# Patient Record
Sex: Female | Born: 1965 | Race: White | Hispanic: No | Marital: Married | Smoking: Never smoker
Health system: Southern US, Community
[De-identification: ages and names within clinical notes are randomized; demographics above are authoritative.]

## PROBLEM LIST (undated history)

## (undated) DIAGNOSIS — G43909 Migraine, unspecified, not intractable, without status migrainosus: Secondary | ICD-10-CM

## (undated) DIAGNOSIS — E079 Disorder of thyroid, unspecified: Secondary | ICD-10-CM

---

## 1999-05-06 ENCOUNTER — Other Ambulatory Visit: Admission: RE | Admit: 1999-05-06 | Discharge: 1999-05-06 | Payer: Self-pay | Admitting: *Deleted

## 2000-05-05 ENCOUNTER — Other Ambulatory Visit: Admission: RE | Admit: 2000-05-05 | Discharge: 2000-05-05 | Payer: Self-pay | Admitting: Obstetrics and Gynecology

## 2000-06-24 ENCOUNTER — Ambulatory Visit (HOSPITAL_COMMUNITY): Admission: RE | Admit: 2000-06-24 | Discharge: 2000-06-24 | Payer: Self-pay | Admitting: Obstetrics and Gynecology

## 2000-06-24 ENCOUNTER — Encounter: Payer: Self-pay | Admitting: Obstetrics and Gynecology

## 2001-05-28 ENCOUNTER — Other Ambulatory Visit: Admission: RE | Admit: 2001-05-28 | Discharge: 2001-05-28 | Payer: Self-pay | Admitting: Obstetrics and Gynecology

## 2001-09-14 ENCOUNTER — Ambulatory Visit (HOSPITAL_BASED_OUTPATIENT_CLINIC_OR_DEPARTMENT_OTHER): Admission: RE | Admit: 2001-09-14 | Discharge: 2001-09-14 | Payer: Self-pay | Admitting: Plastic Surgery

## 2001-09-14 ENCOUNTER — Encounter (INDEPENDENT_AMBULATORY_CARE_PROVIDER_SITE_OTHER): Payer: Self-pay | Admitting: Specialist

## 2001-10-19 ENCOUNTER — Inpatient Hospital Stay (HOSPITAL_COMMUNITY): Admission: AD | Admit: 2001-10-19 | Discharge: 2001-10-19 | Payer: Self-pay | Admitting: Obstetrics and Gynecology

## 2002-01-12 ENCOUNTER — Inpatient Hospital Stay (HOSPITAL_COMMUNITY): Admission: AD | Admit: 2002-01-12 | Discharge: 2002-01-12 | Payer: Self-pay | Admitting: Obstetrics and Gynecology

## 2002-01-31 ENCOUNTER — Encounter: Admission: RE | Admit: 2002-01-31 | Discharge: 2002-01-31 | Payer: Self-pay | Admitting: Obstetrics and Gynecology

## 2002-01-31 ENCOUNTER — Encounter: Payer: Self-pay | Admitting: Obstetrics and Gynecology

## 2002-02-06 ENCOUNTER — Encounter: Payer: Self-pay | Admitting: Obstetrics and Gynecology

## 2002-02-06 ENCOUNTER — Inpatient Hospital Stay (HOSPITAL_COMMUNITY): Admission: RE | Admit: 2002-02-06 | Discharge: 2002-02-06 | Payer: Self-pay | Admitting: Obstetrics and Gynecology

## 2002-02-07 ENCOUNTER — Inpatient Hospital Stay (HOSPITAL_COMMUNITY): Admission: AD | Admit: 2002-02-07 | Discharge: 2002-02-07 | Payer: Self-pay | Admitting: Obstetrics and Gynecology

## 2002-03-06 ENCOUNTER — Inpatient Hospital Stay (HOSPITAL_COMMUNITY): Admission: RE | Admit: 2002-03-06 | Discharge: 2002-03-06 | Payer: Self-pay | Admitting: Obstetrics and Gynecology

## 2002-03-06 ENCOUNTER — Encounter: Payer: Self-pay | Admitting: Obstetrics and Gynecology

## 2002-08-01 ENCOUNTER — Other Ambulatory Visit: Admission: RE | Admit: 2002-08-01 | Discharge: 2002-08-01 | Payer: Self-pay | Admitting: Obstetrics and Gynecology

## 2002-08-05 ENCOUNTER — Encounter: Payer: Self-pay | Admitting: Obstetrics and Gynecology

## 2002-08-05 ENCOUNTER — Encounter: Admission: RE | Admit: 2002-08-05 | Discharge: 2002-08-05 | Payer: Self-pay | Admitting: Obstetrics and Gynecology

## 2002-08-09 ENCOUNTER — Encounter: Payer: Self-pay | Admitting: Family Medicine

## 2002-08-09 ENCOUNTER — Encounter: Admission: RE | Admit: 2002-08-09 | Discharge: 2002-08-09 | Payer: Self-pay | Admitting: Family Medicine

## 2002-08-16 ENCOUNTER — Ambulatory Visit (HOSPITAL_COMMUNITY): Admission: RE | Admit: 2002-08-16 | Discharge: 2002-08-16 | Payer: Self-pay | Admitting: Family Medicine

## 2002-08-16 ENCOUNTER — Encounter: Payer: Self-pay | Admitting: Family Medicine

## 2002-08-26 ENCOUNTER — Encounter: Payer: Self-pay | Admitting: Obstetrics and Gynecology

## 2002-08-26 ENCOUNTER — Encounter: Admission: RE | Admit: 2002-08-26 | Discharge: 2002-08-26 | Payer: Self-pay | Admitting: Obstetrics and Gynecology

## 2002-08-26 ENCOUNTER — Encounter (INDEPENDENT_AMBULATORY_CARE_PROVIDER_SITE_OTHER): Payer: Self-pay | Admitting: Specialist

## 2002-12-05 ENCOUNTER — Ambulatory Visit (HOSPITAL_COMMUNITY): Admission: RE | Admit: 2002-12-05 | Discharge: 2002-12-05 | Payer: Self-pay | Admitting: Endocrinology

## 2002-12-05 ENCOUNTER — Encounter (INDEPENDENT_AMBULATORY_CARE_PROVIDER_SITE_OTHER): Payer: Self-pay | Admitting: *Deleted

## 2002-12-05 ENCOUNTER — Encounter: Payer: Self-pay | Admitting: Endocrinology

## 2003-05-31 ENCOUNTER — Encounter: Admission: RE | Admit: 2003-05-31 | Discharge: 2003-05-31 | Payer: Self-pay | Admitting: Endocrinology

## 2003-08-22 ENCOUNTER — Other Ambulatory Visit: Admission: RE | Admit: 2003-08-22 | Discharge: 2003-08-22 | Payer: Self-pay | Admitting: Obstetrics and Gynecology

## 2003-08-31 ENCOUNTER — Encounter: Admission: RE | Admit: 2003-08-31 | Discharge: 2003-08-31 | Payer: Self-pay | Admitting: Family Medicine

## 2004-07-17 ENCOUNTER — Ambulatory Visit: Payer: Self-pay | Admitting: Oncology

## 2004-08-18 ENCOUNTER — Inpatient Hospital Stay (HOSPITAL_COMMUNITY): Admission: AD | Admit: 2004-08-18 | Discharge: 2004-08-20 | Payer: Self-pay | Admitting: Obstetrics and Gynecology

## 2004-08-23 ENCOUNTER — Inpatient Hospital Stay (HOSPITAL_COMMUNITY): Admission: AD | Admit: 2004-08-23 | Discharge: 2004-08-23 | Payer: Self-pay | Admitting: Obstetrics and Gynecology

## 2004-09-02 ENCOUNTER — Ambulatory Visit: Payer: Self-pay | Admitting: Oncology

## 2004-09-22 ENCOUNTER — Inpatient Hospital Stay (HOSPITAL_COMMUNITY): Admission: AD | Admit: 2004-09-22 | Discharge: 2004-09-25 | Payer: Self-pay | Admitting: Obstetrics & Gynecology

## 2004-09-23 ENCOUNTER — Encounter (INDEPENDENT_AMBULATORY_CARE_PROVIDER_SITE_OTHER): Payer: Self-pay | Admitting: Specialist

## 2004-10-11 ENCOUNTER — Encounter: Admission: RE | Admit: 2004-10-11 | Discharge: 2004-11-10 | Payer: Self-pay | Admitting: Obstetrics and Gynecology

## 2005-01-31 ENCOUNTER — Encounter: Admission: RE | Admit: 2005-01-31 | Discharge: 2005-01-31 | Payer: Self-pay | Admitting: Family Medicine

## 2005-03-20 ENCOUNTER — Encounter (INDEPENDENT_AMBULATORY_CARE_PROVIDER_SITE_OTHER): Payer: Self-pay | Admitting: Specialist

## 2005-03-20 ENCOUNTER — Ambulatory Visit (HOSPITAL_COMMUNITY): Admission: RE | Admit: 2005-03-20 | Discharge: 2005-03-21 | Payer: Self-pay | Admitting: Surgery

## 2005-08-05 ENCOUNTER — Encounter: Admission: RE | Admit: 2005-08-05 | Discharge: 2005-08-05 | Payer: Self-pay | Admitting: Obstetrics and Gynecology

## 2005-12-17 ENCOUNTER — Encounter (INDEPENDENT_AMBULATORY_CARE_PROVIDER_SITE_OTHER): Payer: Self-pay | Admitting: *Deleted

## 2005-12-17 ENCOUNTER — Ambulatory Visit (HOSPITAL_COMMUNITY): Admission: RE | Admit: 2005-12-17 | Discharge: 2005-12-17 | Payer: Self-pay | Admitting: Obstetrics and Gynecology

## 2006-08-13 ENCOUNTER — Encounter: Admission: RE | Admit: 2006-08-13 | Discharge: 2006-08-13 | Payer: Self-pay | Admitting: Obstetrics and Gynecology

## 2007-11-18 ENCOUNTER — Encounter: Admission: RE | Admit: 2007-11-18 | Discharge: 2007-11-18 | Payer: Self-pay | Admitting: Obstetrics and Gynecology

## 2008-08-29 ENCOUNTER — Encounter: Admission: RE | Admit: 2008-08-29 | Discharge: 2008-08-29 | Payer: Self-pay | Admitting: Family Medicine

## 2008-11-21 ENCOUNTER — Encounter: Admission: RE | Admit: 2008-11-21 | Discharge: 2008-11-21 | Payer: Self-pay | Admitting: Obstetrics and Gynecology

## 2010-01-08 ENCOUNTER — Encounter: Admission: RE | Admit: 2010-01-08 | Discharge: 2010-01-08 | Payer: Self-pay | Admitting: Obstetrics and Gynecology

## 2010-09-25 ENCOUNTER — Other Ambulatory Visit: Payer: Self-pay | Admitting: Obstetrics and Gynecology

## 2010-11-08 NOTE — Consult Note (Signed)
NAMEJEFF, MCCALLUM             ACCOUNT NO.:  [id number]   MEDICAL RECORD NO.:  [id number]          PATIENT TYPE:  MAT   LOCATION:  MATC                          FACILITY:  [facility]   PHYSICIAN:  Mark C. Vernie Ammons, M.D.  DATE OF BIRTH:  [birth year]   DATE OF CONSULTATION:  08/23/2004  DATE OF DISCHARGE:                                   CONSULTATION   The patient is a 45 year old white female, who is [redacted] weeks pregnant.  On the  previous Sunday, she began to have some pain in her left flank with  radiation into the abdomen.  A renal ultrasound was performed, and she  reports that is revealed no hydronephrosis.  She was admitted to the  hospital and observed.  She did have an elevated white count but otherwise,  she had no voiding complaints or hematuria.  An IVP was performed on the  27th, and this revealed bilateral caliectasis, more on the right than the  left and dilated ureters down to the uterus and the pelvis, as to be  expected.  No stone was seen, though.  She was discharged home on Tuesday  and did well Wednesday, but yesterday she began to have some pain in the  left lower quadrant, and it has become much worse.  She says it is now  tender to touch.  It is constant with waves of discomfort.  As far as her  urination goes, she says it seems to be somewhat slower.  She is having a  little constipation now because she has been taking pain medication.   PAST MEDICAL HISTORY:  Positive for borderline hyperthyroidism for which she  has undergone removal of multinodular goiter and surgical removal of a  benign breast lump.   MEDICATIONS:  Prenatal vitamins and PTU 25 mg daily.   FAMILY HISTORY:  Positive for hypothyroidism, CVA, depression, and aneurysm.   PHYSICAL EXAMINATION:  GENERAL:  The patient is a well-developed, well-  nourished white female in mild distress.  HEENT:  Atraumatic, normocephalic.  Oropharynx is clear.  NECK:  Supple with midline trachea.  CHEST:  Normal  respiratory effort.  CARDIOVASCULAR:  Regular rate and rhythm.  ABDOMEN:  Gravid, soft.  She is tender in the left lower quadrant, but I see  no lesions, and I can palpate no mass in the area.  She had no CVAT to  percussion.  SKIN:  Warm and dry.  EXTREMITIES:  Without clubbing, cyanosis, or edema.  NEUROLOGIC:  She has no gross focal neurologic deficits.   LABORATORY DATA:  Her urinalysis on the 26th had 0-2 red cells.  Renal  ultrasound revealed a simple cyst in the right kidney but no masses,  hydronephrosis, or stones.  IVP is as noted above.   CT scan:  Study reveals some fullness of the collecting systems bilaterally,  right more than left with some mild dilatation of both ureters down to the  pelvic brim.  I see no stones within the kidneys, nor are there any stones  within the ureters.   IMPRESSION:  Left-sided pain. It appears nonurologic in origin.  She has no  evidence of definite obstruction or stone at this time.   RECOMMENDATIONS:  It would appear that she needs a repeat CBC and possibly  general surgery consult.      MCO/MEDQ  D:  08/23/2004  T:  08/24/2004  Job:  [id number]   cc:   Maxie Better, M.D.  [address]  [place]  [place] [address]  Fax: [phone]

## 2010-11-08 NOTE — Op Note (Signed)
NAMEJANIKA, [name]             ACCOUNT NO.:  [id number]   MEDICAL RECORD NO.:  [id number]          PATIENT TYPE:  AMB   LOCATION:  SDC                           [facility]   PHYSICIAN:  Maxie Better, M.D.DATE OF BIRTH:  19-May-1966   DATE OF PROCEDURE:  12/17/2005  DATE OF DISCHARGE:                                 OPERATIVE REPORT   PREOPERATIVE DIAGNOSIS:  Persistent left lower quadrant pain.   PROCEDURE:  A diagnostic laparoscopy, excision of omental mass.   POSTOPERATIVE DIAGNOSIS:  Diagnostic laparoscopy, omental mass.   ANESTHESIA:  General.   SURGEON:  Maxie Better, M.D.   PROCEDURE:  Under adequate general anesthesia, the patient is placed in the  dorsal lithotomy position.  She was examined under anesthesia.  The uterus  was anteverted, no adnexal masses could be appreciated.  The patient was  then sterilely prepped and draped in usual fashion.  Speculum was placed in  vagina.  Single-tooth tenaculum was placed in the anterior of the cervix.  Acorn cannula was introduced into the cervical os and attached to tenaculum  for manipulation of the uterus.  Attention was then turned to the abdomen.  0.25% Marcaine was injected infraumbilically.  A small infraumbilical  incision was then made.  Veress needle was introduced and tested with  saline.  Carbon dioxide was insufflated.  Veress needle was removed and 10  mm disposable trocar was introduced into the abdomen without incident.  A  lighted video laparoscope was then introduced through that port.  A  suprapubic incision was then made.  A 5 mm port was placed under direct  visualization.  Probe was then utilized in the lower port for inspection of  the pelvis.  The uterus was normal.  Small subserosal fibroid was noted  posteriorly.  Normal tubes bilaterally.  Normal left ovary and no evidence  of endometriosis.  Right ovary was normal.  The appendix was retrocecal.  The liver edge appeared normal.  The  omentum on the left had a mass  pedunculated that was blue-black in appearance.  Using the Gyrus instrument,  the pedicle of the mass was cauterized and then cut.  The endobag was then  utilized to get the specimen in the bag.  A 5 mm laparoscope was introduced  suprapubically to facilitate the procedure.  The mass was removed.  The 10  mm port was then reutilized using a 10 mm scope.  No other abnormalities  were noted and the decision was then made to remove the infraumbilical port.  After the suprapubic site was removed, abdomen deflated.  Care was taken not  to bring up any bowels or additional tissue in the infraumbilical site.  The  infraumbilical port was closed with a 0 Vicryl for fascial stitch and the  skin incisions were then approximated using 4-0 Vicryl.  Instruments from  the vagina were then removed.   SPECIMENS:  The omental mass sent to pathology.   ESTIMATED BLOOD LOSS:  Minimal.   COMPLICATIONS:  None.   The patient tolerated the procedure well, was transferred to recovery in  stable condition.  Maxie Better, M.D.  Electronically Signed     [place]/MEDQ  D:  12/17/2005  T:  12/17/2005  Job:  [id number]

## 2010-11-08 NOTE — Op Note (Signed)
NAMEANAIRIS, KNICK             ACCOUNT NO.:  [id number]   MEDICAL RECORD NO.:  [id number]          PATIENT TYPE:  AMB   LOCATION:  DAY                          FACILITY:  [facility]   PHYSICIAN:  Velora Heckler, MD      DATE OF BIRTH:  14-Jun-1966   DATE OF PROCEDURE:  03/20/2005  DATE OF DISCHARGE:                                 OPERATIVE REPORT   PREOPERATIVE DIAGNOSIS:  1.  Multinodular goiter, dominant right thyroid mass, history of      hyperthyroidism.   POSTOPERATIVE DIAGNOSIS:  1.  Multinodular goiter, dominant right thyroid mass, history of      hyperthyroidism.   PROCEDURE:  Total thyroidectomy.   SURGEON:  Oretha Milch   ASSISTANT:  Consuello Bossier, M.D.   ANESTHESIA:  General.   ESTIMATED BLOOD LOSS:  100 mL.   PREPARATION:  Betadine.   COMPLICATIONS:  None.   INDICATIONS:  Sara [name] is a pleasant 45 year old white female from  [place], [place] who presents at the request of Dr. Dorisann Frames for thyroidectomy for multinodular goiter with dominant right lobe  mass.  This has been followed for some time.  The patient now comes to  surgery for resection.   DESCRIPTION OF PROCEDURE:  The procedure was done in OR #11 at the [facility].  The patient was brought to the operating room,  placed in the supine position on the operating room table.  Following  administration of general anesthesia, the patient is positioned and then  prepped and draped in usual strict aseptic fashion.  After ascertaining that  an adequate level of anesthesia been obtained, a Kocher incision was made  #15 blade. Dissection was carried down through subcutaneous tissues and  platysma.  Hemostasis was obtained with the electrocautery.  The skin flaps  were developed cephalad and caudad from the thyroid notch to the sternal  notch.  The Weitlaner retractor was placed for exposure.  There is deformity  of the upper airway due to the large  right-sided mass. Strap muscles were  incised in the midline.  Strap muscles were reflected laterally on the left  side initially. Left lobe was dissected out.  This was a multinodular right  lobe with what appears to be a large cyst in the superior pole.  Venous  tributaries were divided between small and medium Ligaclip.  The gland was  mobilized.  Superior pole vessels were ligated in continuity with 2-0 silk  ties and medium Ligaclip and divided.  Gland is rolled anteriorly.  Branches  of the inferior thyroid artery were divided between small Ligaclip.  Parathyroid tissue was identified and preserved.  Inferior venous  tributaries were divided between medium Ligaclip.  The gland is rolled  further anteriorly.  Recurrent nerve was identified and preserved. Ligament  of Allyson Sabal was transected with the electrocautery.  Gland was rolled up and  onto the anterior trachea.   Next we turned our attention to the right lobe.  Again strap muscles were  reflected laterally.  There was a large mass in the upper portion  of the  right thyroid lobe.  This was gently mobilized using a Pension scheme manager.  Venous tributaries were divided between small and medium Ligaclip.  Parathyroid tissue was identified and preserved.  Recurrent nerve was  identified and preserved.  The superior pole is ligated in continuity with 2-  0 silk ties and medium Ligaclip and divided.  The gland is rolled further  anteriorly. Branches of the inferior thyroid artery were divided between  small Ligaclip.  Recurrent nerve is identified.  Ligament of Allyson Sabal was  transected with electrocautery, and the gland is rolled up and onto the  anterior trachea. The inferior venous tributaries were divided between  medium Ligaclip, and the gland is completely excised.  Sutures used to mark  the right superior pole.  The gland was submitted to pathology in its  entirety for evaluation.  Neck is irrigated with warm saline. Surgicel was   placed over the area of the recurrent nerves bilaterally.  Good hemostasis  was noted.  Strap muscles were reapproximated midline with interrupted 3-0  Vicryl sutures.  Platysma was closed with interrupted 3-0 Vicryl sutures.  Skin was closed with  running 4-0 Vicryl subcuticular suture. Wound is  washed and dried. Benzoin, Steri-Strips were applied.  Sterile dressings  were applied. The patient is awakened from anesthesia and brought to the  recovery room in stable condition.  The patient tolerated the procedure  well.      Velora Heckler, MD  Electronically Signed     TMG/MEDQ  D:  03/20/2005  T:  03/20/2005  Job:  [id number]   cc:   Dorisann Frames, M.D.  Fax: [phone]   Gretta Arab Valentina Lucks, M.D.  Fax: [phone]   Maxie Better, M.D.  Fax: [phone]

## 2010-11-08 NOTE — Discharge Summary (Signed)
Sara [name], Sara [name]             ACCOUNT NO.:  [id number]   MEDICAL RECORD NO.:  [id number]          PATIENT TYPE:  INP   LOCATION:  9158                          [facility]ILITY:  [facility]   PHYSICIAN:  Lenoard Aden, M.D.DATE OF BIRTH:  11-08-1965   DATE OF ADMISSION:  08/18/2004  DATE OF DISCHARGE:  08/20/2004                                 DISCHARGE SUMMARY   The patient was admitted with left flank pain on August 18, 2004.   Hospital course was complicated by a persistent pain with negative workup,  presumptive urolithiasis.  She was discharged to home on hospital day #3  with marked improvement in her pain.  She was to follow up in the office  within one week.   Medications were Tylox and prenatal vitamins.  Discharge teaching was done.  Followup was scheduled with Dr. Cherly Hensen.      RJT/MEDQ  D:  09/16/2004  T:  09/16/2004  Job:  [id number]

## 2010-11-08 NOTE — H&P (Signed)
Sara [name], BELLOWS             ACCOUNT NO.:  [id number]   MEDICAL RECORD NO.:  [id number]          PATIENT TYPE:  MAT   LOCATION:  MATC                          [facility]   PHYSICIAN:  Lenoard Aden, M.D.DATE OF BIRTH:  Aug 30, 1965   DATE OF ADMISSION:  08/18/2004  DATE OF DISCHARGE:                                HISTORY & PHYSICAL   CHIEF COMPLAINT:  Left flank pain times approximately 10-12 hours.   HISTORY OF PRESENT ILLNESS:  This 45 year old white female, G1, P0, status  post IVF induced conception, with an EDD of September 22, 2004 at 35-1/7 weeks'  gestation who presents with a left flank discomfort associated with nausea  that started approximately 6:30 this morning and has been present for the  entire day.  She reports intermittent nausea.  She reports good fetal  movement.  She denies contractions or vaginal bleeding, leakage of fluid,  dysuria, urgency, frequency, fever, or chills.   MEDICATIONS:  1.  Prenatal vitamins.  2.  PTU 25 mg a day.   PAST MEDICAL HISTORY:  1.  Borderline hyperthyroidism, status post multinodular goiter.  2.  She also had a benign breast lump removed.   FAMILY HISTORY:  Hypothyroidism, stroke, depression, and a brain aneurysm.   LABORATORY DATA:  Her prenatal lab data reveals a blood type of O positive,  Rh antibody negative, rubella immune, hepatitis/HIV negative.  Triple screen  normal.  She has had normal sonogram surveillance with a normal ultrasound  at 20 weeks and a normal ultrasound for __________growth at 28 weeks.   PHYSICAL EXAMINATION:  VITAL SIGNS:  Stable.  The patient is afebrile.  Blood pressure within normal limits.  GENERAL:  She is an otherwise uncomfortable-appearing white female.  HEENT:  Normal.  LUNGS:  Clear.  HEART:  Regular rhythm.  ABDOMEN:  Soft, gravid, and nontender.  PELVIC:  Reveals cervix to be closed, 2 cm long, posterior, vertex, and 0  station.  EXTREMITIES:  There are no cords.  NEUROLOGIC:   Nonfocal.   LABORATORY DATA:  Reveal urinalysis consistent with greater than 80 ketones,  negative nitrite, negative esterase, and trace hemoglobin.  She has a white  blood cell count of 17.3.  She has a normal hemoglobin, hematocrit, and a  known gestational thrombocytopenia with a platelet count of 127,000,  Renal  ultrasound reveals no evidence of hydronephrosis by report and no obvious  active kidney disease nor evidence of kidney stone.   IMPRESSION:  1.  A 35-1/7 week intrauterine pregnancy.  2.  Hyperthyroidism, stable on PTU with normal __________growth.  3.  Status post IVF induced conception.  4.  Left flank pain of unknown etiology with leukocytosis.  No evidence of      classic CDA tenderness and trace hematuria.  Rule out urolithiasis, rule      out possible pyelonephritis.  NST is reactive today.  No regular      contractions noted.   PLAN:  Plan will be to admit for observation and start Ancef 1 g IV q.8.  Will get the patient started on Phenergan for her pain and  nausea.  Will  follow up in the a.m. for complete obstetric ultrasound.  Continuous fetal  monitoring.  See urine for culture and monitor her condition closely at this  time.  Questions are answered.  The patient and her husband are in  attendance.      RJT/MEDQ  D:  08/18/2004  T:  08/18/2004  Job:  [id number]   cc:   [facility]

## 2010-11-08 NOTE — Op Note (Signed)
[name]  Patient:    Sara [name], Sara [name] Visit Number: [id number] MRN: [id number]          Service Type: DSU Location: [facility] Attending Physician:  Consuello Bossier Dictated by:   Consuello Bossier., M.D. Proc. Date: 09/14/01 Admit Date:  09/14/2001                             Operative Report  PREOPERATIVE DIAGNOSIS:  Previously biopsied spindle cell neoplasm, favor traumatized neurofibroma, right lower back/upper buttock area.  POSTOPERATIVE DIAGNOSIS:  Previously biopsied spindle cel neoplasm, favor traumatized neurofibroma, right lower back/upper buttock area.  OPERATION:  Wide excision biopsy with closure of 4-cm complex wound.  SURGEON:  Consuello Bossier., M.D.  ANESTHESIA:  Xylocaine 2% with epinephrine 1:100,000.  FINDINGS:  The patient had a previous punch biopsy of her right lower back lesion which had been there for 15 years or so, showing what was thought to be a traumatized neurofibroma or a spindle cell neoplasm.  It was an unusual lesion pathologically.  Wide local excisional biopsy was advocated, and this was done.  PROCEDURE:  The patient was brought to the operating room and marked off for a planned elliptical excision in the transverse direction of the 1.5 or larger lesion of her right lower back/upper buttock area.  She was prepped with Betadine and draped sterilely.  She was anesthetized with Xylocaine 2% with epinephrine 1:100,000.  Following the onset of anesthesia, the elliptical excisional biopsy was performed.  Bleeding was controlled with the I-cautery unit.  This wound was the closed with interrupted #4-0 Monocryl followed by continuous and interrupted #4-0 Prolene to the skin producing a 4-cm length closure of the 1.9-mm total lesion size.  A light compression dressing was then applied.  The patient tolerated the procedure well and was able to be discharged from the operating room, subsequently to  be followed by me as an outpatient. Dictated by:   Consuello Bossier., M.D. Attending Physician:  Consuello Bossier DD:  09/14/01 TD:  09/15/01 Job: [id number]

## 2010-12-11 ENCOUNTER — Other Ambulatory Visit: Payer: Self-pay | Admitting: Obstetrics and Gynecology

## 2010-12-11 DIAGNOSIS — Z1231 Encounter for screening mammogram for malignant neoplasm of breast: Secondary | ICD-10-CM

## 2011-01-10 ENCOUNTER — Ambulatory Visit
Admission: RE | Admit: 2011-01-10 | Discharge: 2011-01-10 | Disposition: A | Discharge status: Home / Self Care | Payer: 59 | Source: Ambulatory Visit | Attending: Obstetrics and Gynecology | Admitting: Obstetrics and Gynecology

## 2011-01-10 DIAGNOSIS — Z1231 Encounter for screening mammogram for malignant neoplasm of breast: Secondary | ICD-10-CM

## 2012-02-20 ENCOUNTER — Other Ambulatory Visit: Payer: Self-pay | Admitting: Obstetrics and Gynecology

## 2012-02-20 DIAGNOSIS — Z1231 Encounter for screening mammogram for malignant neoplasm of breast: Secondary | ICD-10-CM

## 2012-03-15 ENCOUNTER — Ambulatory Visit
Admission: RE | Admit: 2012-03-15 | Discharge: 2012-03-15 | Disposition: A | Discharge status: Home / Self Care | Payer: 59 | Source: Ambulatory Visit | Attending: Obstetrics and Gynecology | Admitting: Obstetrics and Gynecology

## 2012-03-15 DIAGNOSIS — Z1231 Encounter for screening mammogram for malignant neoplasm of breast: Secondary | ICD-10-CM

## 2013-02-15 ENCOUNTER — Other Ambulatory Visit: Payer: Self-pay

## 2013-02-15 DIAGNOSIS — Z1231 Encounter for screening mammogram for malignant neoplasm of breast: Secondary | ICD-10-CM

## 2013-03-18 ENCOUNTER — Ambulatory Visit
Admission: RE | Admit: 2013-03-18 | Discharge: 2013-03-18 | Disposition: A | Discharge status: Home / Self Care | Payer: BC Managed Care – PPO | Source: Ambulatory Visit

## 2013-03-18 DIAGNOSIS — Z1231 Encounter for screening mammogram for malignant neoplasm of breast: Secondary | ICD-10-CM

## 2013-10-08 ENCOUNTER — Encounter (HOSPITAL_BASED_OUTPATIENT_CLINIC_OR_DEPARTMENT_OTHER): Payer: Self-pay | Admitting: Emergency Medicine

## 2013-10-08 ENCOUNTER — Emergency Department (HOSPITAL_BASED_OUTPATIENT_CLINIC_OR_DEPARTMENT_OTHER)
Admission: EM | Admit: 2013-10-08 | Discharge: 2013-10-08 | Disposition: A | Discharge status: Home / Self Care | Payer: BC Managed Care – PPO | Attending: Emergency Medicine | Admitting: Emergency Medicine

## 2013-10-08 ENCOUNTER — Emergency Department (HOSPITAL_BASED_OUTPATIENT_CLINIC_OR_DEPARTMENT_OTHER): Payer: BC Managed Care – PPO

## 2013-10-08 DIAGNOSIS — G43909 Migraine, unspecified, not intractable, without status migrainosus: Secondary | ICD-10-CM | POA: Insufficient documentation

## 2013-10-08 DIAGNOSIS — Z79899 Other long term (current) drug therapy: Secondary | ICD-10-CM | POA: Insufficient documentation

## 2013-10-08 DIAGNOSIS — E079 Disorder of thyroid, unspecified: Secondary | ICD-10-CM | POA: Insufficient documentation

## 2013-10-08 HISTORY — DX: Migraine, unspecified, not intractable, without status migrainosus: G43.909

## 2013-10-08 HISTORY — DX: Disorder of thyroid, unspecified: E07.9

## 2013-10-08 MED ORDER — DIVALPROEX SODIUM 250 MG PO DR TAB
500.0000 mg | DELAYED_RELEASE_TABLET | Freq: Two times a day (BID) | ORAL | Status: DC
  Administered 2013-10-08: 500 mg via ORAL
  Filled 2013-10-08: qty 2

## 2013-10-08 MED ORDER — PROMETHAZINE HCL 25 MG/ML IJ SOLN
12.5000 mg | Freq: Once | INTRAMUSCULAR | Status: AC
  Administered 2013-10-08: 12.5 mg via INTRAMUSCULAR
  Filled 2013-10-08: qty 1

## 2013-10-08 MED ORDER — KETOROLAC TROMETHAMINE 60 MG/2ML IM SOLN
60.0000 mg | Freq: Once | INTRAMUSCULAR | Status: AC
  Administered 2013-10-08: 60 mg via INTRAMUSCULAR
  Filled 2013-10-08: qty 2

## 2013-10-08 MED ORDER — DIPHENHYDRAMINE HCL 50 MG/ML IJ SOLN
25.0000 mg | Freq: Once | INTRAMUSCULAR | Status: AC
  Administered 2013-10-08: 25 mg via INTRAMUSCULAR
  Filled 2013-10-08: qty 1

## 2013-10-08 MED ORDER — METOCLOPRAMIDE HCL 5 MG/ML IJ SOLN
10.0000 mg | Freq: Once | INTRAMUSCULAR | Status: AC
  Administered 2013-10-08: 10 mg via INTRAMUSCULAR
  Filled 2013-10-08: qty 2

## 2013-10-08 NOTE — ED Notes (Signed)
Pt reports history of migraine, migraine started last night, she took her medications, laid down woke up around 12 min took another pill. This am she was awoken by sever pain

## 2013-10-08 NOTE — ED Provider Notes (Signed)
CSN: [id number]     Arrival date & time 10/08/13  [id number] History   None    Chief Complaint  Patient presents with  . Migraine     (Consider location/radiation/quality/duration/timing/severity/associated sxs/prior Treatment) Patient is a 48 y.o. female presenting with migraines. The history is provided by the patient.  Migraine This is a recurrent problem. The current episode started 3 to 5 hours ago (had one yesterday and resolved and then awoke with one at midnight not responsive to imitrex). The problem occurs constantly. The problem has not changed since onset.Associated symptoms include headaches. Pertinent negatives include no chest pain, no abdominal pain and no shortness of breath. Nothing aggravates the symptoms. Nothing relieves the symptoms. Treatments tried: imitirex. The treatment provided no relief.    Past Medical History  Diagnosis Date  . Migraine   . Thyroid disease    History reviewed. No pertinent past surgical history. History reviewed. No pertinent family history. History  Substance Use Topics  . Smoking status: Never Smoker   . Smokeless tobacco: Not on file  . Alcohol Use: Yes     Comment: ocassionally   OB History   Grav Para Term Preterm Abortions TAB SAB Ect Mult Living                 Review of Systems  Constitutional: Negative for fever.  Respiratory: Negative for shortness of breath.   Cardiovascular: Negative for chest pain.  Gastrointestinal: Negative for abdominal pain.  Musculoskeletal: Negative for neck stiffness.  Skin: Negative for rash.  Neurological: Positive for headaches. Negative for syncope, facial asymmetry, speech difficulty, weakness and numbness.  All other systems reviewed and are negative.     Allergies  Review of patient's allergies indicates no known allergies.  Home Medications   Prior to Admission medications   Medication Sig Start Date End Date Taking? Authorizing Provider  levothyroxine (SYNTHROID, LEVOTHROID)  100 MCG tablet Take 100 mcg by mouth daily before breakfast.   Yes Historical Provider, MD  SUMAtriptan (IMITREX) 100 MG tablet Take 100 mg by mouth every 2 (two) hours as needed for migraine or headache. May repeat in 2 hours if headache persists or recurs.   Yes Historical Provider, MD   BP 124/69  Pulse 81  Temp(Src) 98.6 F (37 C) (Oral)  Resp 18  Ht 5\' 5"  (1.651 m)  Wt 158 lb (71.668 kg)  BMI 26.29 kg/m2  SpO2 100%  LMP 09/19/2013 Physical Exam  Constitutional: She is oriented to person, place, and time. She appears well-developed and well-nourished. No distress.  HENT:  Head: Normocephalic and atraumatic.  Mouth/Throat: Oropharynx is clear and moist.  Eyes: Conjunctivae and EOM are normal. Pupils are equal, round, and reactive to light.  Neck: Normal range of motion. Neck supple.  No meningeal signs  Cardiovascular: Normal rate, regular rhythm and intact distal pulses.   Pulmonary/Chest: Effort normal and breath sounds normal. She has no wheezes. She has no rales.  Abdominal: Soft. Bowel sounds are normal. There is no tenderness.  Musculoskeletal: Normal range of motion. She exhibits no edema.  Lymphadenopathy:    She has no cervical adenopathy.  Neurological: She is alert and oriented to person, place, and time. She has normal reflexes. She displays normal reflexes. No cranial nerve deficit. She exhibits normal muscle tone. Coordination normal.  Skin: Skin is warm and dry.  Psychiatric: She has a normal mood and affect.    ED Course  Procedures (including critical care time) Labs Review Labs Reviewed - No  data to display  Imaging Review No results found.   EKG Interpretation None      MDM   Final diagnoses:  None    Medications  ketorolac (TORADOL) injection 60 mg (60 mg Intramuscular Given 10/08/13 0505)  metoCLOPramide (REGLAN) injection 10 mg (10 mg Intramuscular Given 10/08/13 0505)  diphenhydrAMINE (BENADRYL) injection 25 mg (25 mg Intramuscular Given  10/08/13 0505)  promethazine (PHENERGAN) injection 12.5 mg (12.5 mg Intramuscular Given 10/08/13 0506)     No indication for LP at this time.  Marked improvement post medication will d/c home with close follow up  Hildagard Sobecki K Kina Shiffman-Rasch, MD 10/08/13 [phone]

## 2013-10-08 NOTE — Discharge Instructions (Signed)
Migraine Headache A migraine headache is an intense, throbbing pain on one or both sides of your head. A migraine can last for 30 minutes to several hours. CAUSES  The exact cause of a migraine headache is not always known. However, a migraine may be caused when nerves in the brain become irritated and release chemicals that cause inflammation. This causes pain. Certain things may also trigger migraines, such as:  Alcohol.  Smoking.  Stress.  Menstruation.  Aged cheeses.  Foods or drinks that contain nitrates, glutamate, aspartame, or tyramine.  Lack of sleep.  Chocolate.  Caffeine.  Hunger.  Physical exertion.  Fatigue.  Medicines used to treat chest pain (nitroglycerine), birth control pills, estrogen, and some blood pressure medicines. SIGNS AND SYMPTOMS  Pain on one or both sides of your head.  Pulsating or throbbing pain.  Severe pain that prevents daily activities.  Pain that is aggravated by any physical activity.  Nausea, vomiting, or both.  Dizziness.  Pain with exposure to bright lights, loud noises, or activity.  General sensitivity to bright lights, loud noises, or smells. Before you get a migraine, you may get warning signs that a migraine is coming (aura). An aura may include:  Seeing flashing lights.  Seeing bright spots, halos, or zig-zag lines.  Having tunnel vision or blurred vision.  Having feelings of numbness or tingling.  Having trouble talking.  Having muscle weakness. DIAGNOSIS  A migraine headache is often diagnosed based on:  Symptoms.  Physical exam.  A CT scan or MRI of your head. These imaging tests cannot diagnose migraines, but they can help rule out other causes of headaches. TREATMENT Medicines may be given for pain and nausea. Medicines can also be given to help prevent recurrent migraines.  HOME CARE INSTRUCTIONS  Only take over-the-counter or prescription medicines for pain or discomfort as directed by your  health care provider. The use of long-term narcotics is not recommended.  Lie down in a dark, quiet room when you have a migraine.  Keep a journal to find out what may trigger your migraine headaches. For example, write down:  What you eat and drink.  How much sleep you get.  Any change to your diet or medicines.  Limit alcohol consumption.  Quit smoking if you smoke.  Get 7 9 hours of sleep, or as recommended by your health care provider.  Limit stress.  Keep lights dim if bright lights bother you and make your migraines worse. SEEK IMMEDIATE MEDICAL CARE IF:   Your migraine becomes severe.  You have a fever.  You have a stiff neck.  You have vision loss.  You have muscular weakness or loss of muscle control.  You start losing your balance or have trouble walking.  You feel faint or pass out.  You have severe symptoms that are different from your first symptoms. MAKE SURE YOU:   Understand these instructions.  Will watch your condition.  Will get help right away if you are not doing well or get worse. Document Released: 06/09/2005 Document Revised: 03/30/2013 Document Reviewed: 02/14/2013 [employer] Patient Information 2014 [employer].  

## 2014-02-13 ENCOUNTER — Other Ambulatory Visit: Payer: Self-pay

## 2014-02-13 DIAGNOSIS — Z1231 Encounter for screening mammogram for malignant neoplasm of breast: Secondary | ICD-10-CM

## 2014-03-28 ENCOUNTER — Ambulatory Visit
Admission: RE | Admit: 2014-03-28 | Discharge: 2014-03-28 | Disposition: A | Discharge status: Home / Self Care | Payer: BC Managed Care – PPO | Source: Ambulatory Visit

## 2014-03-28 DIAGNOSIS — Z1231 Encounter for screening mammogram for malignant neoplasm of breast: Secondary | ICD-10-CM

## 2014-12-23 IMAGING — CT CT HEAD W/O CM
1 series · 16 of 30 positions shown, 20 images · non-contrast
Comparison: 08/29/2008

CLINICAL DATA: Migraines starting last night.

EXAM:
CT HEAD WITHOUT CONTRAST
TECHNIQUE: Contiguous axial images were obtained from the base of the skull
through the vertex without intravenous contrast.

[Series 2: head 4.8 h37s · axial · 0.47mm/px · z∈[-161,-9]mm · 16 of 36 slices shown, 20 images]
[im 2/36  brain]
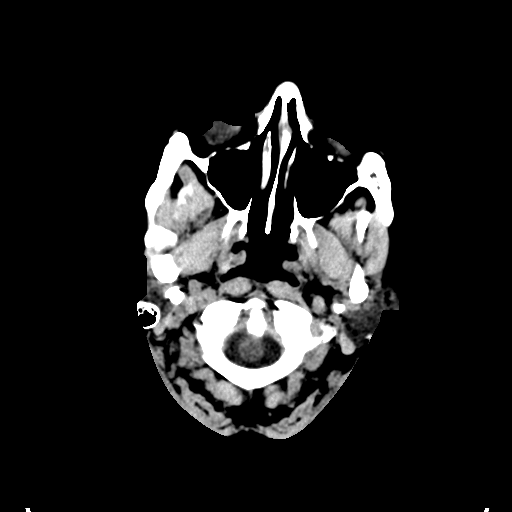
[im 2/36  bone]
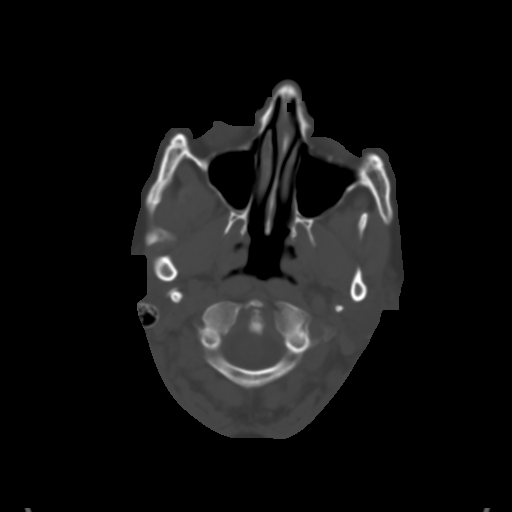
[im 4/36  brain]
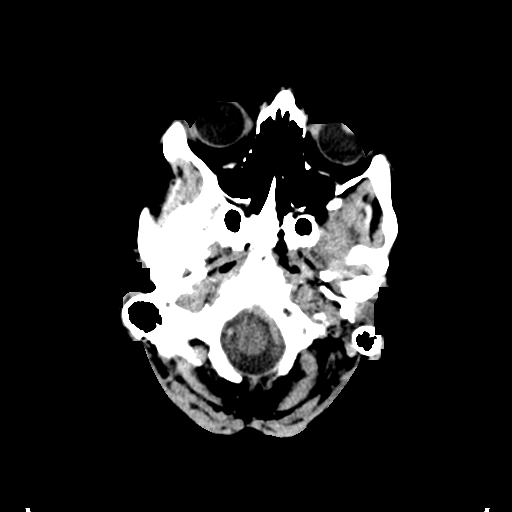
[im 7/36  brain]
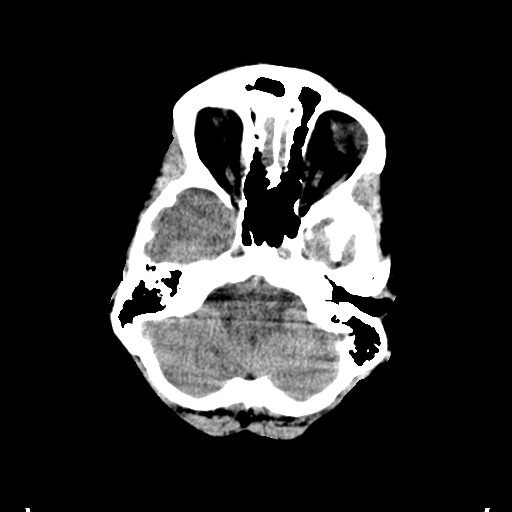
[im 9/36  brain]
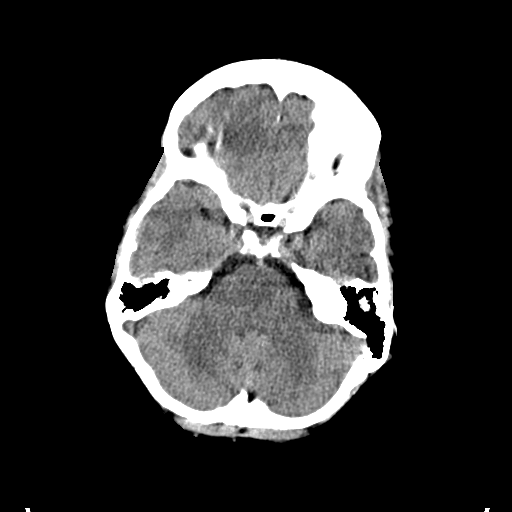
[im 10/36  brain]
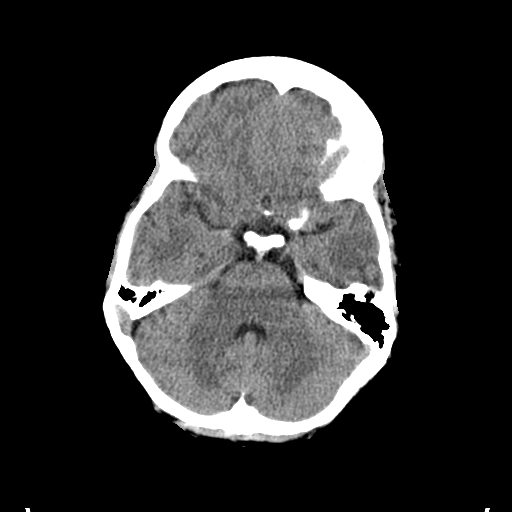
[im 10/36  bone]
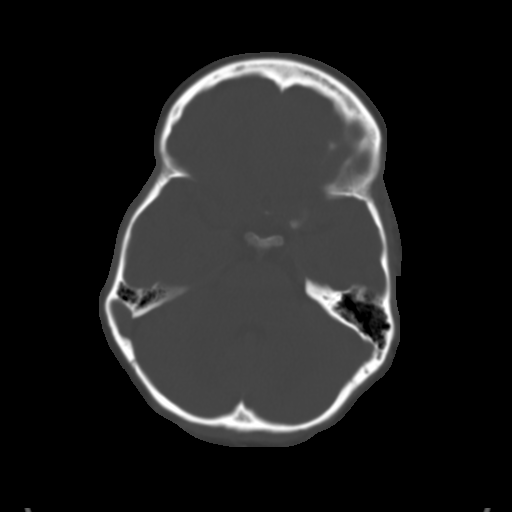
[im 13/36  brain]
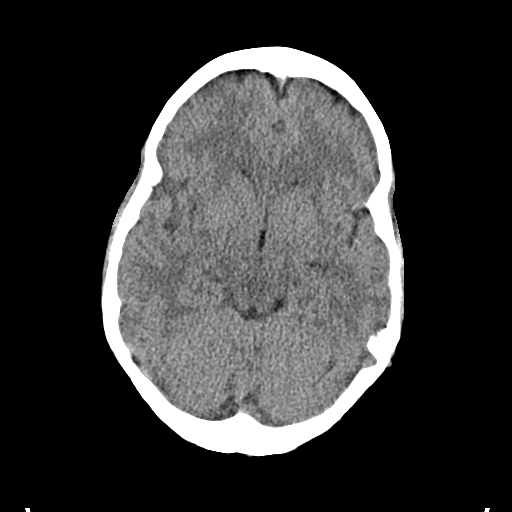
[im 15/36  brain]
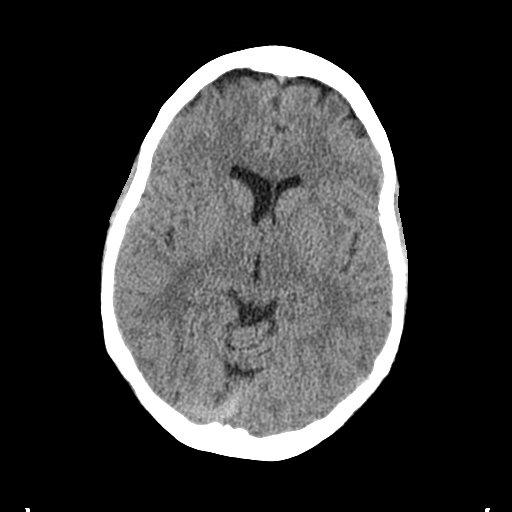
[im 17/36  brain]
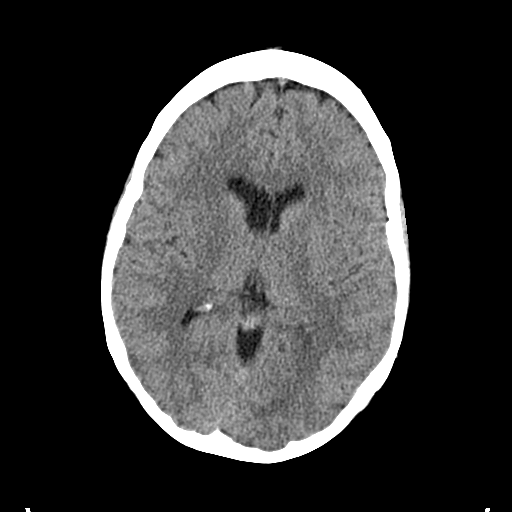
[im 19/36  brain]
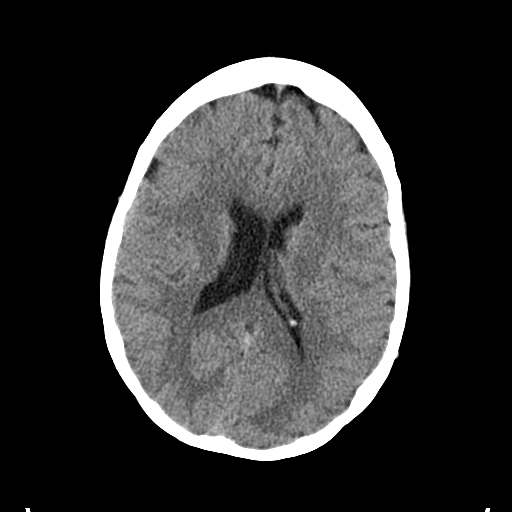
[im 19/36  bone]
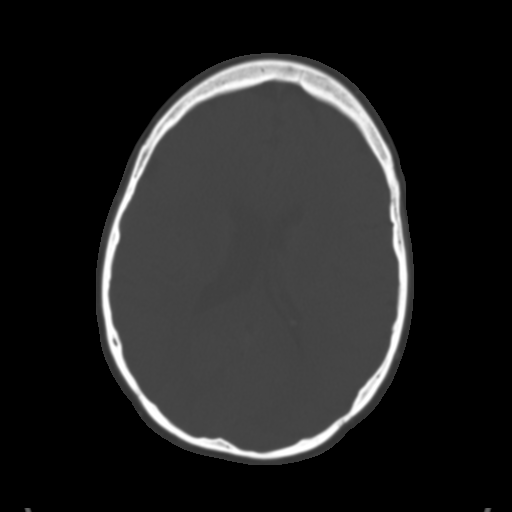
[im 21/36  brain]
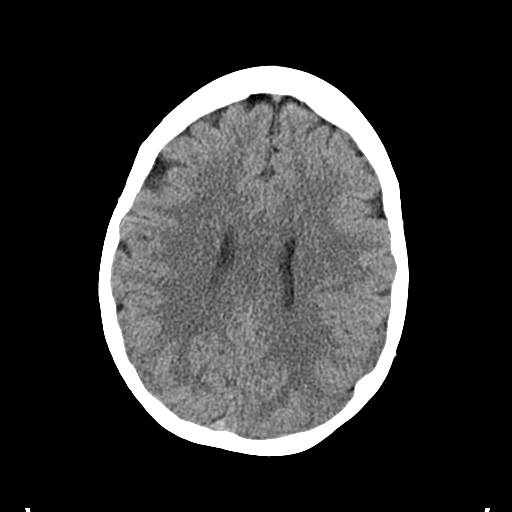
[im 23/36  brain]
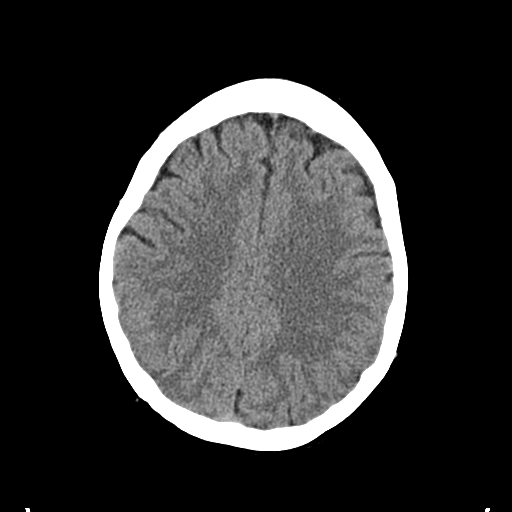
[im 26/36  brain]
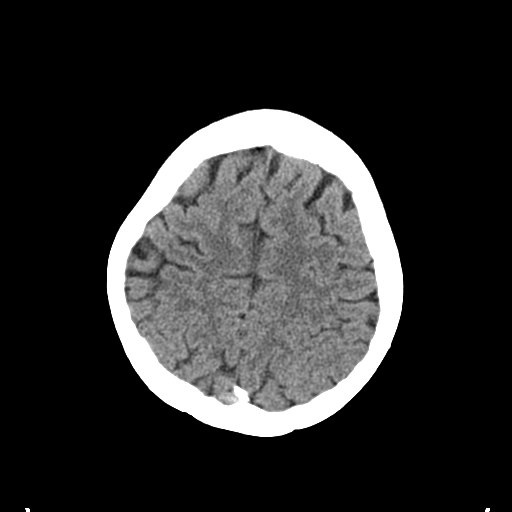
[im 27/36  brain]
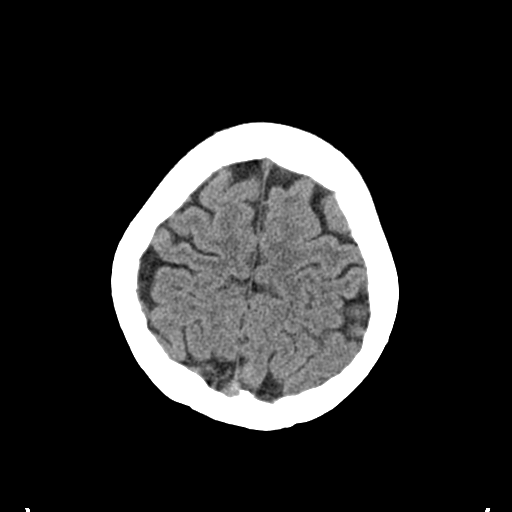
[im 27/36  bone]
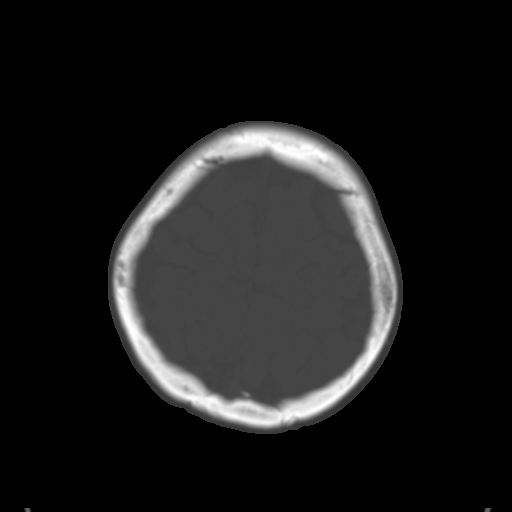
[im 29/36  brain]
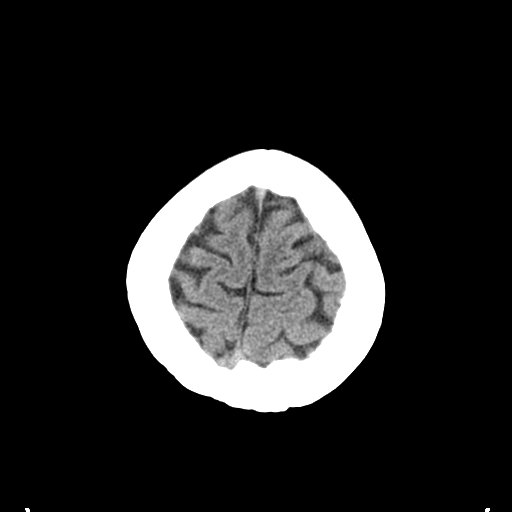
[im 32/36  brain]
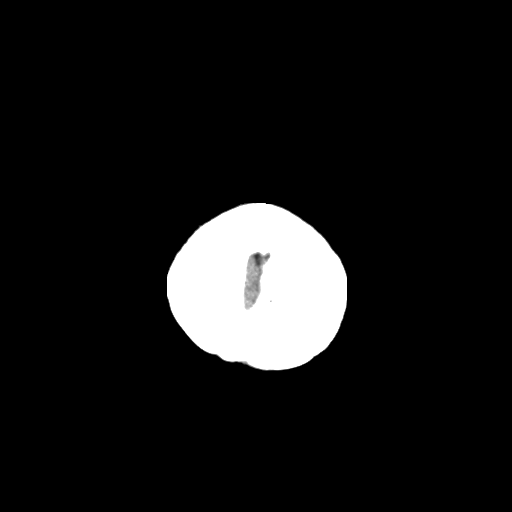
[im 34/36  brain]
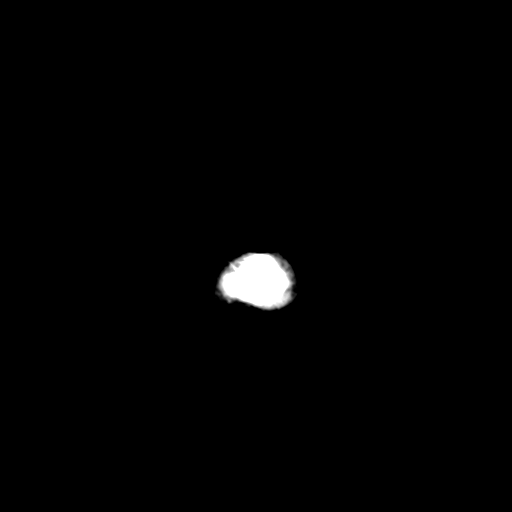

[16 of 30 positions shown; findings below may reference images not displayed]

FINDINGS: Ventricles and sulci appear symmetrical. No mass effect or midline
shift. No abnormal extra-axial fluid collections. Gray-white matter
junctions are distinct. Basal cisterns are not effaced. No evidence
of acute intracranial hemorrhage. No depressed skull fractures.
Visualized paranasal sinuses and mastoid air cells are not
opacified.
IMPRESSION: No acute intracranial abnormalities. Unchanged appearance since
previous study.

## 2015-03-14 ENCOUNTER — Emergency Department (HOSPITAL_BASED_OUTPATIENT_CLINIC_OR_DEPARTMENT_OTHER)
Admission: EM | Admit: 2015-03-14 | Discharge: 2015-03-14 | Disposition: A | Discharge status: Home / Self Care | Payer: BLUE CROSS/BLUE SHIELD | Attending: Emergency Medicine | Admitting: Emergency Medicine

## 2015-03-14 ENCOUNTER — Encounter (HOSPITAL_BASED_OUTPATIENT_CLINIC_OR_DEPARTMENT_OTHER): Payer: Self-pay | Admitting: *Deleted

## 2015-03-14 DIAGNOSIS — E079 Disorder of thyroid, unspecified: Secondary | ICD-10-CM | POA: Diagnosis not present

## 2015-03-14 DIAGNOSIS — R197 Diarrhea, unspecified: Secondary | ICD-10-CM | POA: Diagnosis present

## 2015-03-14 DIAGNOSIS — Z79899 Other long term (current) drug therapy: Secondary | ICD-10-CM | POA: Diagnosis not present

## 2015-03-14 DIAGNOSIS — Z792 Long term (current) use of antibiotics: Secondary | ICD-10-CM | POA: Diagnosis not present

## 2015-03-14 DIAGNOSIS — G43909 Migraine, unspecified, not intractable, without status migrainosus: Secondary | ICD-10-CM | POA: Diagnosis not present

## 2015-03-14 DIAGNOSIS — K5732 Diverticulitis of large intestine without perforation or abscess without bleeding: Secondary | ICD-10-CM | POA: Insufficient documentation

## 2015-03-14 DIAGNOSIS — R109 Unspecified abdominal pain: Secondary | ICD-10-CM

## 2015-03-14 MED ORDER — METRONIDAZOLE 500 MG PO TABS
500.0000 mg | ORAL_TABLET | Freq: Once | ORAL | Status: AC
  Administered 2015-03-14: 500 mg via ORAL
  Filled 2015-03-14: qty 1

## 2015-03-14 MED ORDER — CIPROFLOXACIN HCL 500 MG PO TABS
500.0000 mg | ORAL_TABLET | Freq: Once | ORAL | Status: AC
  Administered 2015-03-14: 500 mg via ORAL
  Filled 2015-03-14: qty 1

## 2015-03-14 MED ORDER — CIPROFLOXACIN HCL 500 MG PO TABS: 500.0000 mg | ORAL_TABLET | Freq: Two times a day (BID) | ORAL | Status: AC

## 2015-03-14 MED ORDER — METRONIDAZOLE 500 MG PO TABS: 500.0000 mg | ORAL_TABLET | Freq: Two times a day (BID) | ORAL | Status: AC

## 2015-03-14 NOTE — ED Provider Notes (Signed)
CSN: [id number]     Arrival date & time 03/14/15  1503 History   First MD Initiated Contact with Patient 03/14/15 1511     Chief Complaint  Patient presents with  . Diarrhea      HPI  Patient presents for evaluation of diarrhea and abdominal pain. She relates her symptoms 5 weeks ago to a course of Zithromax that she took for an upper respiration infection. Since that time she's had intermittent episodes of diarrhea every several days. No fevers no chills. Has some nausea today and developed some left lower abdomen and left mid abdominal pain. She was seen in urgent care and referred here. She states "they told me they couldn't do all the testing and even air".  Had a history of some abdominal pain after delivery of her child 10 years ago had a sigmoidoscopy. Does not know if she had diverticuli. Hospital ultrasound and GI evaluation as well. She is otherwise healthy at baseline. She has a history of migraines, hypothyroidism. She's never had blood in her stools. She's not had fever or chills. She has no urinary symptoms.  Past Medical History  Diagnosis Date  . Migraine   . Thyroid disease    History reviewed. No pertinent past surgical history. History reviewed. No pertinent family history. Social History  Substance Use Topics  . Smoking status: Never Smoker   . Smokeless tobacco: None  . Alcohol Use: Yes     Comment: ocassionally   OB History    No data available     Review of Systems  Constitutional: Negative for fever, chills, diaphoresis, appetite change and fatigue.  HENT: Negative for mouth sores, sore throat and trouble swallowing.   Eyes: Negative for visual disturbance.  Respiratory: Negative for cough, chest tightness, shortness of breath and wheezing.   Cardiovascular: Negative for chest pain.  Gastrointestinal: Positive for abdominal pain and diarrhea. Negative for nausea, vomiting and abdominal distention.  Endocrine: Negative for polydipsia, polyphagia and  polyuria.  Genitourinary: Negative for dysuria, frequency and hematuria.  Musculoskeletal: Negative for gait problem.  Skin: Negative for color change, pallor and rash.  Neurological: Negative for dizziness, syncope, light-headedness and headaches.  Hematological: Does not bruise/bleed easily.  Psychiatric/Behavioral: Negative for behavioral problems and confusion.      Allergies  Review of patient's allergies indicates no known allergies.  Home Medications   Prior to Admission medications   Medication Sig Start Date End Date Taking? Authorizing Provider  ciprofloxacin (CIPRO) 500 MG tablet Take 1 tablet (500 mg total) by mouth every 12 (twelve) hours. 03/14/15   Rolland Porter, MD  levothyroxine (SYNTHROID, LEVOTHROID) 100 MCG tablet Take 100 mcg by mouth daily before breakfast.    Historical Provider, MD  metroNIDAZOLE (FLAGYL) 500 MG tablet Take 1 tablet (500 mg total) by mouth 2 (two) times daily. 03/14/15   Rolland Porter, MD  SUMAtriptan (IMITREX) 100 MG tablet Take 100 mg by mouth every 2 (two) hours as needed for migraine or headache. May repeat in 2 hours if headache persists or recurs.    Historical Provider, MD   BP 133/57 mmHg  Pulse 88  Temp(Src) 98.4 F (36.9 C) (Oral)  Resp 16  Ht  (1.651 m)  Wt 160 lb (72.576 kg)  BMI 26.63 kg/m2  SpO2 100% Physical Exam  Constitutional: She is oriented to person, place, and time. She appears well-developed and well-nourished. No distress.  HENT:  Head: Normocephalic.  Eyes: Conjunctivae are normal. Pupils are equal, round, and reactive to  light. No scleral icterus.  Neck: Normal range of motion. Neck supple. No thyromegaly present.  Cardiovascular: Normal rate and regular rhythm.  Exam reveals no gallop and no friction rub.   No murmur heard. Pulmonary/Chest: Effort normal and breath sounds normal. No respiratory distress. She has no wheezes. She has no rales.  Abdominal: Soft. Bowel sounds are normal. She exhibits no distension.  There is no tenderness. There is no rebound.    Musculoskeletal: Normal range of motion.  Neurological: She is alert and oriented to person, place, and time.  Skin: Skin is warm and dry. No rash noted.  Psychiatric: She has a normal mood and affect. Her behavior is normal.    ED Course  Procedures (including critical care time) Labs Review Labs Reviewed - No data to display  Imaging Review No results found. I have personally reviewed and evaluated these images and lab results as part of my medical decision-making.   EKG Interpretation None      MDM   Final diagnoses:  Abdominal pain, unspecified abdominal location  Diarrhea  Diverticulitis of large intestine without perforation or abscess without bleeding    We had a long discussion about evaluation and treatment. This may represent C. difficile. However, it would be unlikely for this to have 5 weeks of symptoms and only be every several days. This could represent a diverticulitis with her having diarrhea, now developing pain. Her exam and symptoms and findings in no way suggest abscess, perforation, or complication. She had a [place] concern about her CT scan because she also had a CT scan for her evaluation 10 years ago. I've offered her the option of empiric treatment with short course of antibiotics of Flagyl and Cipro. And reevaluation if not improving. This would be with the caveat that she would be indeed an educated guess, but empiric diagnosis and treatment. She strongly prefers the latter. She has no contraindications. She denies pregnancy. She has no allergies.    Rolland Porter, MD 03/14/15 [id number]

## 2015-03-14 NOTE — ED Notes (Signed)
Pt amb to triage with quick steady gait smiling in nad. Pt reports having diarrhea every 4 days with no bm's in between. Also some nausea today. Seen at a walk in clinic and told to come here for eval.

## 2015-03-14 NOTE — ED Notes (Signed)
MD at bedside. 

## 2015-03-14 NOTE — Discharge Instructions (Signed)
Per our discussion you are being treated empirically for the possibility of a diverticulitis. If you become worse, develop fever or bloody stools, or any additional symptoms or complications please recheck. No alcohol while taking Flagyl.  Diarrhea Diarrhea is watery poop (stool). It can make you feel weak, tired, thirsty, or give you a dry mouth (signs of dehydration). Watery poop is a sign of another problem, most often an infection. It often lasts 2-3 days. It can last longer if it is a sign of something serious. Take care of yourself as told by your doctor. HOME CARE   Drink 1 cup (8 ounces) of fluid each time you have watery poop.  Do not drink the following fluids:  Those that contain simple sugars (fructose, glucose, galactose, lactose, sucrose, maltose).  Sports drinks.  Fruit juices.  Whole milk products.  Sodas.  Drinks with caffeine (coffee, tea, soda) or alcohol.  Oral rehydration solution may be used if the doctor says it is okay. You may make your own solution. Follow this recipe:   - teaspoon table salt.   teaspoon baking soda.   teaspoon salt substitute containing potassium chloride.  1 tablespoons sugar.  1 liter (34 ounces) of water.  Avoid the following foods:  High fiber foods, such as raw fruits and vegetables.  Nuts, seeds, and whole grain breads and cereals.   Those that are sweetened with sugar alcohols (xylitol, sorbitol, mannitol).  Try eating the following foods:  Starchy foods, such as rice, toast, pasta, low-sugar cereal, oatmeal, baked potatoes, crackers, and bagels.  Bananas.  Applesauce.  Eat probiotic-rich foods, such as yogurt and milk products that are fermented.  Wash your hands well after each time you have watery poop.  Only take medicine as told by your doctor.  Take a warm bath to help lessen burning or pain from having watery poop. GET HELP RIGHT AWAY IF:   You cannot drink fluids without throwing up  (vomiting).  You keep throwing up.  You have blood in your poop, or your poop looks black and tarry.  You do not pee (urinate) in 6-8 hours, or there is only a small amount of very dark pee.  You have belly (abdominal) pain that gets worse or stays in the same spot (localizes).  You are weak, dizzy, confused, or light-headed.  You have a very bad headache.  Your watery poop gets worse or does not get better.  You have a fever or lasting symptoms for more than 2-3 days.  You have a fever and your symptoms suddenly get worse. MAKE SURE YOU:   Understand these instructions.  Will watch your condition.  Will get help right away if you are not doing well or get worse. Document Released: 11/26/2007 Document Revised: 10/24/2013 Document Reviewed: 02/15/2012 [facility] Patient Information 2015 [place], [place]. This information is not intended to replace advice given to you by your health care provider. Make sure you discuss any questions you have with your health care provider.  Diverticulitis Diverticulitis is inflammation or infection of small pouches in your colon that form when you have a condition called diverticulosis. The pouches in your colon are called diverticula. Your colon, or large intestine, is where water is absorbed and stool is formed. Complications of diverticulitis can include:  Bleeding.  Severe infection.  Severe pain.  Perforation of your colon.  Obstruction of your colon. CAUSES  Diverticulitis is caused by bacteria. Diverticulitis happens when stool becomes trapped in diverticula. This allows bacteria to grow in  the diverticula, which can lead to inflammation and infection. RISK FACTORS People with diverticulosis are at risk for diverticulitis. Eating a diet that does not include enough fiber from fruits and vegetables may make diverticulitis more likely to develop. SYMPTOMS  Symptoms of diverticulitis may include:  Abdominal pain and tenderness. The  pain is normally located on the left side of the abdomen, but may occur in other areas.  Fever and chills.  Bloating.  Cramping.  Nausea.  Vomiting.  Constipation.  Diarrhea.  Blood in your stool. DIAGNOSIS  Your health care provider will ask you about your medical history and do a physical exam. You may need to have tests done because many medical conditions can cause the same symptoms as diverticulitis. Tests may include:  Blood tests.  Urine tests.  Imaging tests of the abdomen, including X-rays and CT scans. When your condition is under control, your health care provider may recommend that you have a colonoscopy. A colonoscopy can show how severe your diverticula are and whether something else is causing your symptoms. TREATMENT  Most cases of diverticulitis are mild and can be treated at home. Treatment may include:  Taking over-the-counter pain medicines.  Following a clear liquid diet.  Taking antibiotic medicines by mouth for 7-10 days. More severe cases may be treated at a hospital. Treatment may include:  Not eating or drinking.  Taking prescription pain medicine.  Receiving antibiotic medicines through an IV tube.  Receiving fluids and nutrition through an IV tube.  Surgery. HOME CARE INSTRUCTIONS   Follow your health care provider's instructions carefully.  Follow a full liquid diet or other diet as directed by your health care provider. After your symptoms improve, your health care provider may tell you to change your diet. He or she may recommend you eat a high-fiber diet. Fruits and vegetables are good sources of fiber. Fiber makes it easier to pass stool.  Take fiber supplements or probiotics as directed by your health care provider.  Only take medicines as directed by your health care provider.  Keep all your follow-up appointments. SEEK MEDICAL CARE IF:   Your pain does not improve.  You have a hard time eating food.  Your bowel  movements do not return to normal. SEEK IMMEDIATE MEDICAL CARE IF:   Your pain becomes worse.  Your symptoms do not get better.  Your symptoms suddenly get worse.  You have a fever.  You have repeated vomiting.  You have bloody or black, tarry stools. MAKE SURE YOU:   Understand these instructions.  Will watch your condition.  Will get help right away if you are not doing well or get worse. Document Released: 03/19/2005 Document Revised: 06/14/2013 Document Reviewed: 05/04/2013 [facility] Patient Information 2015 [place], [place]. This information is not intended to replace advice given to you by your health care provider. Make sure you discuss any questions you have with your health care provider.

## 2023-09-14 ENCOUNTER — Other Ambulatory Visit: Payer: Self-pay | Admitting: Obstetrics and Gynecology

## 2023-09-14 ENCOUNTER — Other Ambulatory Visit (HOSPITAL_COMMUNITY)
Admission: RE | Admit: 2023-09-14 | Discharge: 2023-09-14 | Disposition: A | Discharge status: Home / Self Care | Source: Ambulatory Visit | Attending: Obstetrics and Gynecology | Admitting: Obstetrics and Gynecology

## 2023-09-14 DIAGNOSIS — Z01419 Encounter for gynecological examination (general) (routine) without abnormal findings: Secondary | ICD-10-CM | POA: Diagnosis present

## 2023-09-15 LAB — CYTOLOGY - PAP
Comment: NEGATIVE
Diagnosis: NEGATIVE
High risk HPV: NEGATIVE
